# Patient Record
Sex: Female | Born: 1965 | Race: White | Hispanic: No | Marital: Married
Health system: Southern US, Community
[De-identification: ages and names within clinical notes are randomized; demographics above are authoritative.]

---

## 2005-10-24 ENCOUNTER — Encounter: Admission: RE | Admit: 2005-10-24 | Discharge: 2005-10-24 | Payer: Self-pay | Admitting: Obstetrics and Gynecology

## 2006-12-04 ENCOUNTER — Encounter: Admission: RE | Admit: 2006-12-04 | Discharge: 2006-12-04 | Payer: Self-pay | Admitting: Obstetrics and Gynecology

## 2007-12-15 ENCOUNTER — Encounter: Admission: RE | Admit: 2007-12-15 | Discharge: 2007-12-15 | Payer: Self-pay | Admitting: Obstetrics and Gynecology

## 2008-12-20 ENCOUNTER — Encounter: Admission: RE | Admit: 2008-12-20 | Discharge: 2008-12-20 | Payer: Self-pay | Admitting: Obstetrics and Gynecology

## 2010-01-09 ENCOUNTER — Encounter: Admission: RE | Admit: 2010-01-09 | Discharge: 2010-01-09 | Payer: Self-pay | Admitting: Obstetrics and Gynecology

## 2011-01-04 ENCOUNTER — Other Ambulatory Visit: Payer: Self-pay | Admitting: Obstetrics and Gynecology

## 2011-01-04 DIAGNOSIS — Z1231 Encounter for screening mammogram for malignant neoplasm of breast: Secondary | ICD-10-CM

## 2011-01-19 ENCOUNTER — Ambulatory Visit
Admission: RE | Admit: 2011-01-19 | Discharge: 2011-01-19 | Disposition: A | Discharge status: Home / Self Care | Payer: 59 | Source: Ambulatory Visit | Attending: Obstetrics and Gynecology | Admitting: Obstetrics and Gynecology

## 2011-01-19 DIAGNOSIS — Z1231 Encounter for screening mammogram for malignant neoplasm of breast: Secondary | ICD-10-CM

## 2011-01-24 ENCOUNTER — Other Ambulatory Visit: Payer: Self-pay | Admitting: Obstetrics and Gynecology

## 2011-01-24 DIAGNOSIS — R928 Other abnormal and inconclusive findings on diagnostic imaging of breast: Secondary | ICD-10-CM

## 2011-01-30 ENCOUNTER — Other Ambulatory Visit: Payer: 59

## 2011-02-01 ENCOUNTER — Ambulatory Visit
Admission: RE | Admit: 2011-02-01 | Discharge: 2011-02-01 | Disposition: A | Discharge status: Home / Self Care | Payer: 59 | Source: Ambulatory Visit | Attending: Obstetrics and Gynecology | Admitting: Obstetrics and Gynecology

## 2011-02-01 DIAGNOSIS — R928 Other abnormal and inconclusive findings on diagnostic imaging of breast: Secondary | ICD-10-CM

## 2012-01-14 ENCOUNTER — Other Ambulatory Visit: Payer: Self-pay | Admitting: Obstetrics and Gynecology

## 2012-01-14 DIAGNOSIS — Z1231 Encounter for screening mammogram for malignant neoplasm of breast: Secondary | ICD-10-CM

## 2012-02-26 ENCOUNTER — Ambulatory Visit
Admission: RE | Admit: 2012-02-26 | Discharge: 2012-02-26 | Disposition: A | Discharge status: Home / Self Care | Payer: 59 | Source: Ambulatory Visit | Attending: Obstetrics and Gynecology | Admitting: Obstetrics and Gynecology

## 2012-02-26 DIAGNOSIS — Z1231 Encounter for screening mammogram for malignant neoplasm of breast: Secondary | ICD-10-CM

## 2013-02-23 ENCOUNTER — Other Ambulatory Visit: Payer: Self-pay

## 2013-02-23 DIAGNOSIS — Z1231 Encounter for screening mammogram for malignant neoplasm of breast: Secondary | ICD-10-CM

## 2013-03-27 ENCOUNTER — Ambulatory Visit
Admission: RE | Admit: 2013-03-27 | Discharge: 2013-03-27 | Disposition: A | Discharge status: Home / Self Care | Payer: 59 | Source: Ambulatory Visit

## 2013-03-27 DIAGNOSIS — Z1231 Encounter for screening mammogram for malignant neoplasm of breast: Secondary | ICD-10-CM

## 2014-03-01 ENCOUNTER — Other Ambulatory Visit: Payer: Self-pay

## 2014-03-01 DIAGNOSIS — Z1231 Encounter for screening mammogram for malignant neoplasm of breast: Secondary | ICD-10-CM

## 2014-04-19 ENCOUNTER — Ambulatory Visit: Payer: 59

## 2014-05-04 ENCOUNTER — Ambulatory Visit
Admission: RE | Admit: 2014-05-04 | Discharge: 2014-05-04 | Disposition: A | Discharge status: Home / Self Care | Payer: 59 | Source: Ambulatory Visit

## 2014-05-04 DIAGNOSIS — Z1231 Encounter for screening mammogram for malignant neoplasm of breast: Secondary | ICD-10-CM

## 2015-04-21 ENCOUNTER — Other Ambulatory Visit: Payer: Self-pay

## 2015-04-21 DIAGNOSIS — Z1231 Encounter for screening mammogram for malignant neoplasm of breast: Secondary | ICD-10-CM

## 2015-06-02 ENCOUNTER — Ambulatory Visit
Admission: RE | Admit: 2015-06-02 | Discharge: 2015-06-02 | Disposition: A | Discharge status: Home / Self Care | Payer: 59 | Source: Ambulatory Visit

## 2015-06-02 DIAGNOSIS — Z1231 Encounter for screening mammogram for malignant neoplasm of breast: Secondary | ICD-10-CM

## 2016-03-21 ENCOUNTER — Other Ambulatory Visit: Payer: Self-pay | Admitting: Obstetrics and Gynecology

## 2016-03-21 DIAGNOSIS — Z1231 Encounter for screening mammogram for malignant neoplasm of breast: Secondary | ICD-10-CM

## 2016-06-06 ENCOUNTER — Ambulatory Visit
Admission: RE | Admit: 2016-06-06 | Discharge: 2016-06-06 | Disposition: A | Discharge status: Home / Self Care | Payer: 59 | Source: Ambulatory Visit | Attending: Obstetrics and Gynecology | Admitting: Obstetrics and Gynecology

## 2016-06-06 DIAGNOSIS — Z1231 Encounter for screening mammogram for malignant neoplasm of breast: Secondary | ICD-10-CM

## 2017-04-10 ENCOUNTER — Other Ambulatory Visit: Payer: Self-pay | Admitting: Obstetrics and Gynecology

## 2017-04-10 DIAGNOSIS — Z1231 Encounter for screening mammogram for malignant neoplasm of breast: Secondary | ICD-10-CM

## 2017-06-07 ENCOUNTER — Ambulatory Visit
Admission: RE | Admit: 2017-06-07 | Discharge: 2017-06-07 | Disposition: A | Discharge status: Home / Self Care | Payer: 59 | Source: Ambulatory Visit | Attending: Obstetrics and Gynecology | Admitting: Obstetrics and Gynecology

## 2017-06-07 DIAGNOSIS — Z1231 Encounter for screening mammogram for malignant neoplasm of breast: Secondary | ICD-10-CM

## 2018-02-11 ENCOUNTER — Other Ambulatory Visit: Payer: Self-pay | Admitting: Obstetrics and Gynecology

## 2018-02-11 DIAGNOSIS — Z1231 Encounter for screening mammogram for malignant neoplasm of breast: Secondary | ICD-10-CM

## 2018-06-09 ENCOUNTER — Ambulatory Visit
Admission: RE | Admit: 2018-06-09 | Discharge: 2018-06-09 | Disposition: A | Discharge status: Home / Self Care | Payer: 59 | Source: Ambulatory Visit | Attending: Obstetrics and Gynecology | Admitting: Obstetrics and Gynecology

## 2018-06-09 DIAGNOSIS — Z1231 Encounter for screening mammogram for malignant neoplasm of breast: Secondary | ICD-10-CM

## 2019-02-24 ENCOUNTER — Other Ambulatory Visit: Payer: Self-pay | Admitting: Obstetrics and Gynecology

## 2019-02-24 DIAGNOSIS — Z1231 Encounter for screening mammogram for malignant neoplasm of breast: Secondary | ICD-10-CM

## 2019-06-11 ENCOUNTER — Ambulatory Visit
Admission: RE | Admit: 2019-06-11 | Discharge: 2019-06-11 | Disposition: A | Discharge status: Home / Self Care | Payer: 59 | Source: Ambulatory Visit | Attending: Obstetrics and Gynecology | Admitting: Obstetrics and Gynecology

## 2019-06-11 ENCOUNTER — Other Ambulatory Visit: Payer: Self-pay

## 2019-06-11 DIAGNOSIS — Z1231 Encounter for screening mammogram for malignant neoplasm of breast: Secondary | ICD-10-CM

## 2019-09-18 ENCOUNTER — Ambulatory Visit: Payer: 59 | Attending: Internal Medicine

## 2019-09-18 DIAGNOSIS — Z23 Encounter for immunization: Secondary | ICD-10-CM

## 2019-09-18 NOTE — Progress Notes (Signed)
   Covid-19 Vaccination Clinic  Name:  Shirley Martin    MRN: [id number] DOB: 01/27/1966  09/18/2019  [name] was observed post Covid-19 immunization for 15 minutes without incident. She was provided with Vaccine Information Sheet and instruction to access the V-Safe system.   [name] was instructed to call 911 with any severe reactions post vaccine: Marland Kitchen Difficulty breathing  . Swelling of face and throat  . A fast heartbeat  . A bad rash all over body  . Dizziness and weakness   Immunizations Administered    Name Date Dose VIS Date Route   Pfizer COVID-19 Vaccine 09/18/2019  4:53 PM 0.3 mL 05/22/2019 Intramuscular   Manufacturer: [employer], [employer]   Lot: [id number]   NDC: [address]-0

## 2019-10-09 ENCOUNTER — Ambulatory Visit: Payer: 59 | Attending: Internal Medicine

## 2019-10-09 DIAGNOSIS — Z23 Encounter for immunization: Secondary | ICD-10-CM

## 2019-10-09 NOTE — Progress Notes (Signed)
   Covid-19 Vaccination Clinic  Name:  Shirley Martin    MRN: [id number] DOB: 1966/03/09  10/09/2019  Shirley Martin was observed post Covid-19 immunization for 15 minutes without incident. She was provided with Vaccine Information Sheet and instruction to access the V-Safe system.   Shirley Martin was instructed to call 911 with any severe reactions post vaccine: Marland Kitchen Difficulty breathing  . Swelling of face and throat  . A fast heartbeat  . A bad rash all over body  . Dizziness and weakness   Immunizations Administered    Name Date Dose VIS Date Route   Pfizer COVID-19 Vaccine 10/09/2019  8:01 AM 0.3 mL 08/05/2018 Intramuscular   Manufacturer: [employer], [employer]   Lot: [id number]   NDC: [address]-3

## 2019-10-13 ENCOUNTER — Ambulatory Visit: Payer: 59

## 2020-01-18 ENCOUNTER — Other Ambulatory Visit: Payer: Self-pay | Admitting: Obstetrics and Gynecology

## 2020-01-18 DIAGNOSIS — Z1231 Encounter for screening mammogram for malignant neoplasm of breast: Secondary | ICD-10-CM

## 2020-02-13 LAB — EXTERNAL GENERIC LAB PROCEDURE: COLOGUARD: NEGATIVE

## 2020-02-13 LAB — COLOGUARD: COLOGUARD: NEGATIVE

## 2020-06-09 ENCOUNTER — Other Ambulatory Visit: Payer: Self-pay

## 2020-06-09 ENCOUNTER — Ambulatory Visit
Admission: RE | Admit: 2020-06-09 | Discharge: 2020-06-09 | Disposition: A | Discharge status: Home / Self Care | Payer: 59 | Source: Ambulatory Visit | Attending: Obstetrics and Gynecology | Admitting: Obstetrics and Gynecology

## 2020-06-09 DIAGNOSIS — Z1231 Encounter for screening mammogram for malignant neoplasm of breast: Secondary | ICD-10-CM

## 2021-02-23 ENCOUNTER — Other Ambulatory Visit: Payer: Self-pay | Admitting: Obstetrics and Gynecology

## 2021-02-23 DIAGNOSIS — Z1231 Encounter for screening mammogram for malignant neoplasm of breast: Secondary | ICD-10-CM

## 2021-06-08 ENCOUNTER — Ambulatory Visit
Admission: RE | Admit: 2021-06-08 | Discharge: 2021-06-08 | Disposition: A | Discharge status: Home / Self Care | Payer: No Typology Code available for payment source | Source: Ambulatory Visit | Attending: Obstetrics and Gynecology | Admitting: Obstetrics and Gynecology

## 2021-06-08 DIAGNOSIS — Z1231 Encounter for screening mammogram for malignant neoplasm of breast: Secondary | ICD-10-CM

## 2021-12-25 IMAGING — MG DIGITAL SCREENING BILAT W/ TOMO W/ CAD
6 of 10 series · 6 of 30 positions shown · non-contrast
Comparison: Previous exam(s).

CLINICAL DATA: Screening.

EXAM:
DIGITAL SCREENING BILATERAL MAMMOGRAM WITH TOMO AND CAD

[L MLO synth-2D]
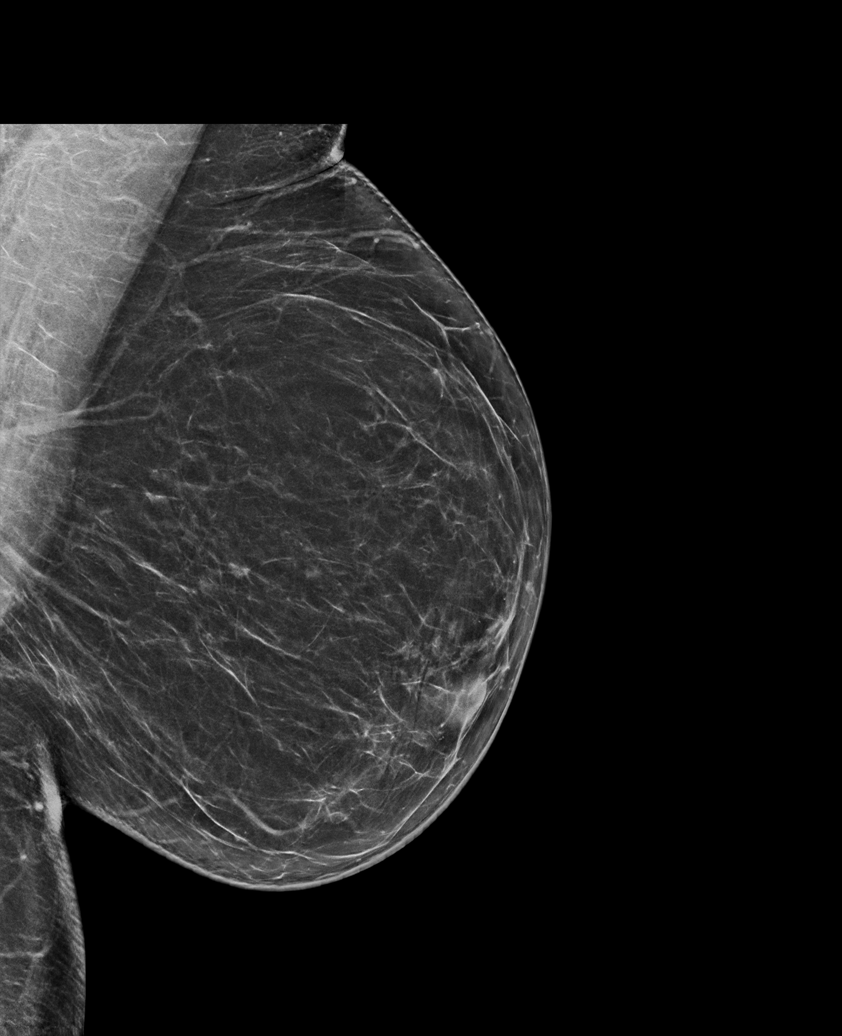

[L CC synth-2D (1 of 2)]
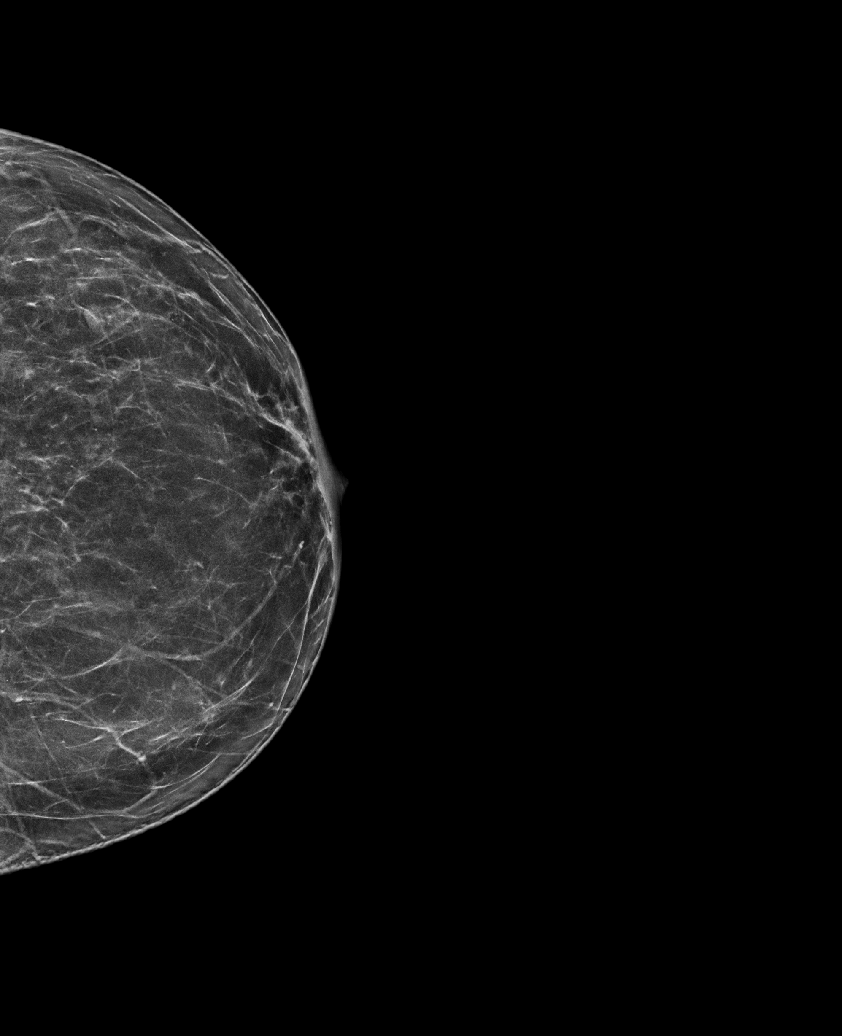

[R CC synth-2D]
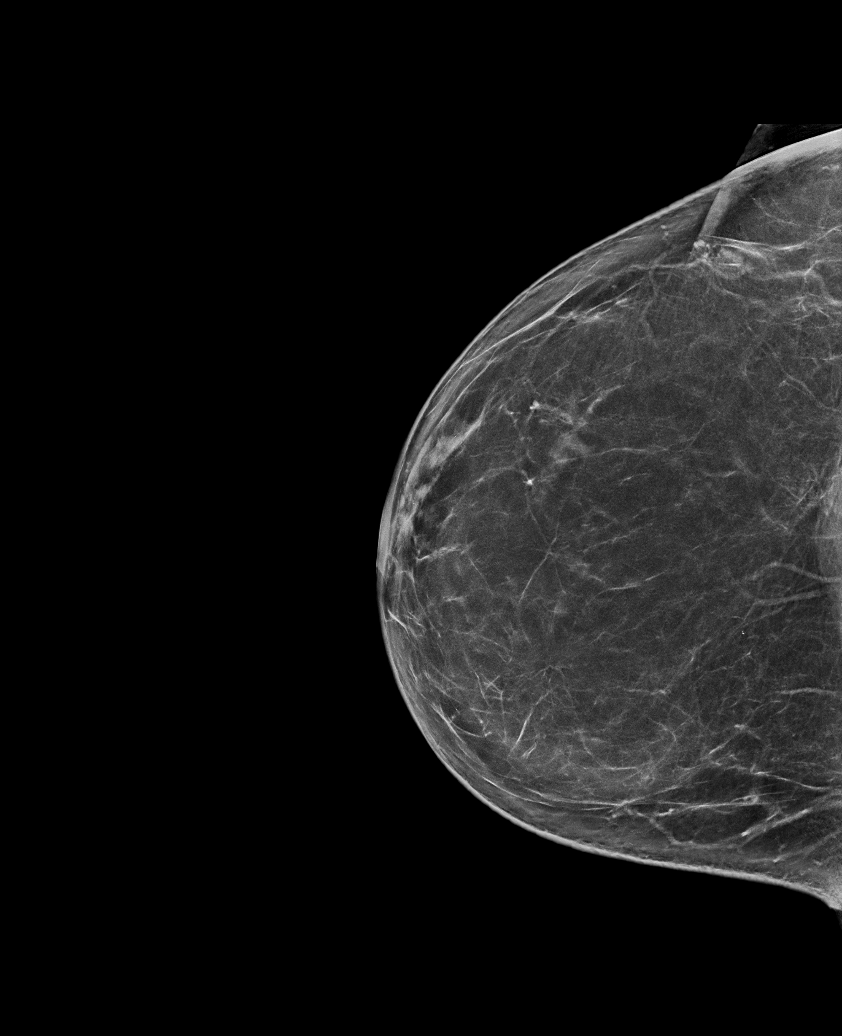

[R MLO synth-2D]
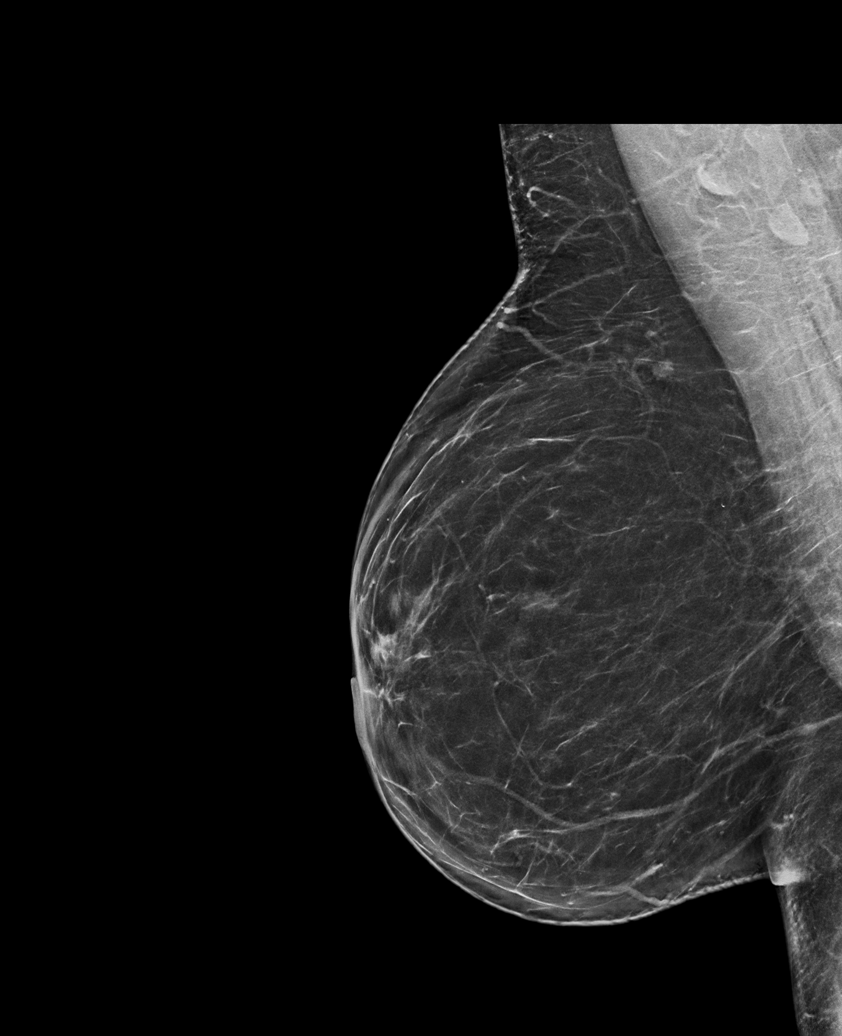

[L CC synth-2D (2 of 2)]
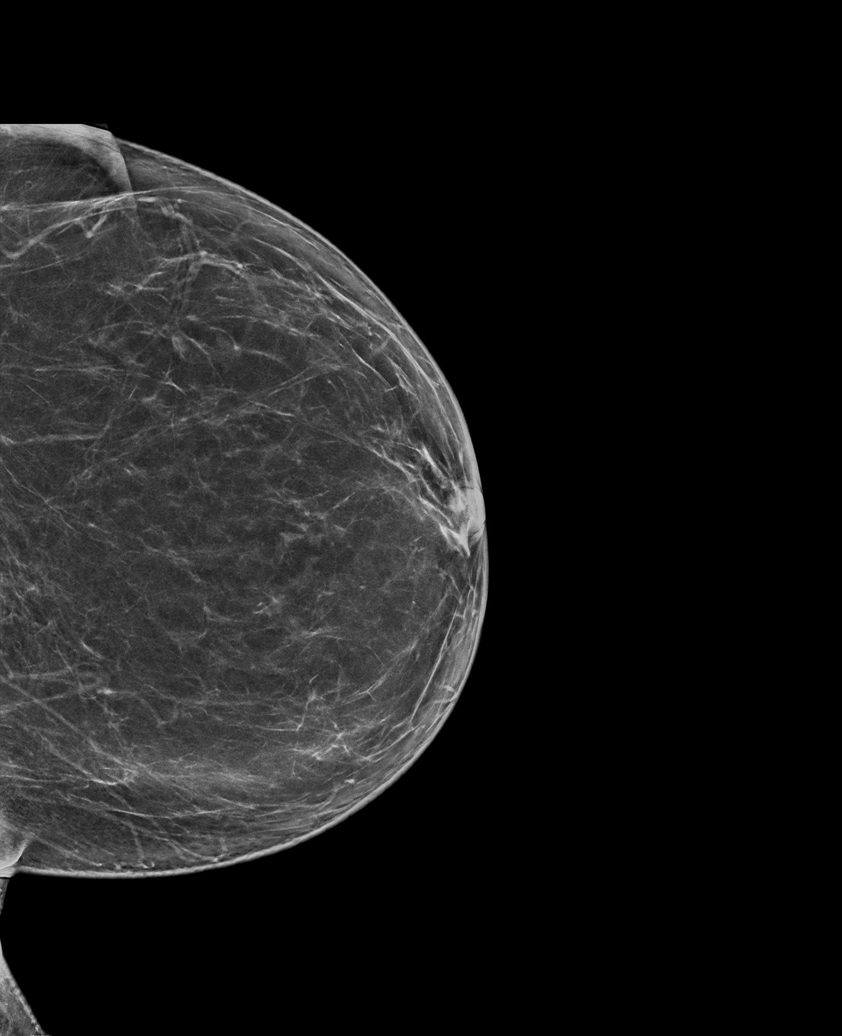

[R CC tomo · tomo slice 39/76.0]
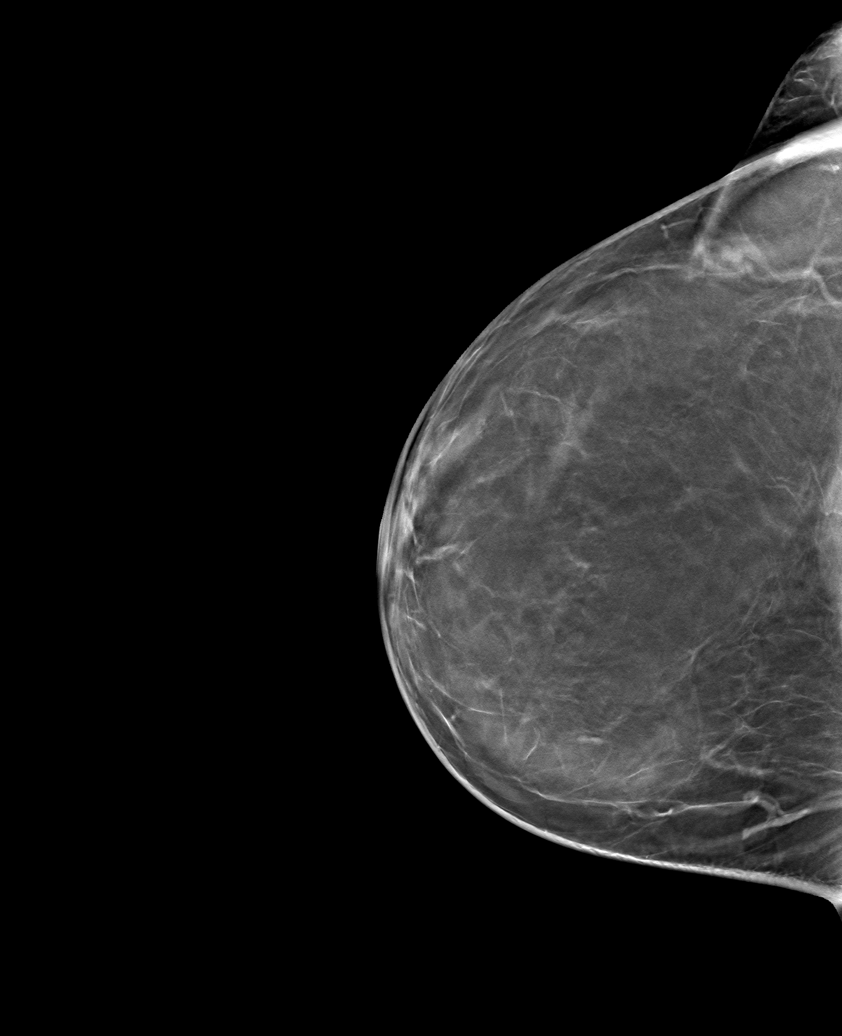

[6 of 30 positions shown; findings below may reference images not displayed]

ACR Breast Density Category b: There are scattered areas of
fibroglandular density.
FINDINGS: There are no findings suspicious for malignancy. Images were
processed with CAD.
IMPRESSION: No mammographic evidence of malignancy. A result letter of this
screening mammogram will be mailed directly to the patient.

RECOMMENDATION:
Screening mammogram in one year. (Code:[id number])

BI-RADS CATEGORY  1: Negative.

## 2022-04-24 ENCOUNTER — Other Ambulatory Visit: Payer: Self-pay | Admitting: Obstetrics and Gynecology

## 2022-04-24 DIAGNOSIS — Z1231 Encounter for screening mammogram for malignant neoplasm of breast: Secondary | ICD-10-CM

## 2022-06-08 ENCOUNTER — Ambulatory Visit
Admission: RE | Admit: 2022-06-08 | Discharge: 2022-06-08 | Disposition: A | Discharge status: Home / Self Care | Payer: No Typology Code available for payment source | Source: Ambulatory Visit | Attending: Obstetrics and Gynecology | Admitting: Obstetrics and Gynecology

## 2022-06-08 DIAGNOSIS — Z1231 Encounter for screening mammogram for malignant neoplasm of breast: Secondary | ICD-10-CM

## 2023-03-15 ENCOUNTER — Other Ambulatory Visit: Payer: Self-pay | Admitting: Obstetrics and Gynecology

## 2023-03-15 DIAGNOSIS — Z1231 Encounter for screening mammogram for malignant neoplasm of breast: Secondary | ICD-10-CM

## 2023-04-14 LAB — COLOGUARD: COLOGUARD: NEGATIVE

## 2023-04-14 LAB — EXTERNAL GENERIC LAB PROCEDURE: COLOGUARD: NEGATIVE

## 2023-06-10 ENCOUNTER — Ambulatory Visit: Payer: No Typology Code available for payment source

## 2023-06-14 ENCOUNTER — Ambulatory Visit
Admission: RE | Admit: 2023-06-14 | Discharge: 2023-06-14 | Disposition: A | Discharge status: Home / Self Care | Payer: 59 | Source: Ambulatory Visit | Attending: Obstetrics and Gynecology | Admitting: Obstetrics and Gynecology

## 2023-06-14 DIAGNOSIS — Z1231 Encounter for screening mammogram for malignant neoplasm of breast: Secondary | ICD-10-CM

## 2024-04-17 ENCOUNTER — Other Ambulatory Visit: Payer: Self-pay | Admitting: Obstetrics and Gynecology

## 2024-04-17 DIAGNOSIS — Z1231 Encounter for screening mammogram for malignant neoplasm of breast: Secondary | ICD-10-CM

## 2024-06-12 ENCOUNTER — Ambulatory Visit
Admission: RE | Admit: 2024-06-12 | Discharge: 2024-06-12 | Disposition: A | Discharge status: Home / Self Care | Source: Ambulatory Visit | Attending: Obstetrics and Gynecology | Admitting: Obstetrics and Gynecology

## 2024-06-12 DIAGNOSIS — Z1231 Encounter for screening mammogram for malignant neoplasm of breast: Secondary | ICD-10-CM
